# Patient Record
Sex: Female | Born: 1950 | Hispanic: Yes | Marital: Married | Smoking: Never smoker
Health system: Southern US, Community
[De-identification: ages and names within clinical notes are randomized; demographics above are authoritative.]

## PROBLEM LIST (undated history)

## (undated) DIAGNOSIS — I10 Essential (primary) hypertension: Secondary | ICD-10-CM

## (undated) DIAGNOSIS — J45909 Unspecified asthma, uncomplicated: Secondary | ICD-10-CM

## (undated) HISTORY — PX: ABDOMINAL HYSTERECTOMY: SHX81

## (undated) HISTORY — PX: SHOULDER ARTHROSCOPY DISTAL CLAVICLE EXCISION AND OPEN ROTATOR CUFF REPAIR: SHX2396

---

## 2004-06-27 ENCOUNTER — Ambulatory Visit: Payer: Self-pay | Admitting: Obstetrics and Gynecology

## 2004-07-23 ENCOUNTER — Ambulatory Visit: Payer: Self-pay

## 2014-12-22 ENCOUNTER — Other Ambulatory Visit: Payer: Self-pay | Admitting: Primary Care

## 2014-12-22 DIAGNOSIS — M810 Age-related osteoporosis without current pathological fracture: Secondary | ICD-10-CM

## 2016-11-05 ENCOUNTER — Other Ambulatory Visit: Payer: Self-pay | Admitting: Primary Care

## 2016-11-05 DIAGNOSIS — Z139 Encounter for screening, unspecified: Secondary | ICD-10-CM

## 2016-11-05 DIAGNOSIS — M810 Age-related osteoporosis without current pathological fracture: Secondary | ICD-10-CM

## 2017-01-25 ENCOUNTER — Emergency Department: Payer: No Typology Code available for payment source

## 2017-01-25 ENCOUNTER — Emergency Department
Admission: EM | Admit: 2017-01-25 | Discharge: 2017-01-25 | Disposition: A | Discharge status: Home / Self Care | Payer: No Typology Code available for payment source | Attending: Emergency Medicine | Admitting: Emergency Medicine

## 2017-01-25 ENCOUNTER — Encounter: Payer: Self-pay | Admitting: Intensive Care

## 2017-01-25 DIAGNOSIS — J45909 Unspecified asthma, uncomplicated: Secondary | ICD-10-CM | POA: Diagnosis not present

## 2017-01-25 DIAGNOSIS — M545 Low back pain: Secondary | ICD-10-CM | POA: Insufficient documentation

## 2017-01-25 DIAGNOSIS — I10 Essential (primary) hypertension: Secondary | ICD-10-CM | POA: Insufficient documentation

## 2017-01-25 DIAGNOSIS — M542 Cervicalgia: Secondary | ICD-10-CM | POA: Insufficient documentation

## 2017-01-25 HISTORY — DX: Essential (primary) hypertension: I10

## 2017-01-25 HISTORY — DX: Unspecified asthma, uncomplicated: J45.909

## 2017-01-25 MED ORDER — IBUPROFEN 400 MG PO TABS: 400.0000 mg | ORAL_TABLET | Freq: Four times a day (QID) | ORAL | 0 refills | Status: DC | PRN

## 2017-01-25 MED ORDER — CYCLOBENZAPRINE HCL 5 MG PO TABS: 5.0000 mg | ORAL_TABLET | Freq: Three times a day (TID) | ORAL | 0 refills | Status: DC | PRN

## 2017-01-25 MED ORDER — IBUPROFEN 400 MG PO TABS: 400.0000 mg | ORAL_TABLET | Freq: Four times a day (QID) | ORAL | 0 refills | Status: AC | PRN

## 2017-01-25 MED ORDER — CYCLOBENZAPRINE HCL 5 MG PO TABS: 5.0000 mg | ORAL_TABLET | Freq: Every day | ORAL | 0 refills | Status: AC

## 2017-01-25 NOTE — Discharge Instructions (Signed)
Follow-up with primary care as soon as possible for further evaluation of thyroid nodule.

## 2017-01-25 NOTE — ED Notes (Signed)
Pt able to ambulate independently and denies increased pain while walking.
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
## 2017-01-31 ENCOUNTER — Other Ambulatory Visit: Payer: Self-pay | Admitting: Primary Care

## 2017-01-31 DIAGNOSIS — E041 Nontoxic single thyroid nodule: Secondary | ICD-10-CM

## 2017-02-05 ENCOUNTER — Ambulatory Visit: Payer: Medicare HMO

## 2017-11-05 ENCOUNTER — Other Ambulatory Visit: Payer: Self-pay | Admitting: Primary Care

## 2017-11-17 ENCOUNTER — Encounter: Payer: Self-pay | Admitting: Primary Care

## 2017-11-18 ENCOUNTER — Telehealth: Payer: Self-pay | Admitting: Gastroenterology

## 2017-11-18 NOTE — Telephone Encounter (Signed)
Gastroenterology Pre-Procedure Review  Request Date: 12/10/2017    [facility] Requesting Physician: Dr. Bonna Gains   PATIENT REVIEW QUESTIONS: The patient responded to the following health history questions as indicated:    1. Are you having any GI issues? no 2. Do you have a personal history of Polyps? yes (5 yrs ???) 3. Do you have a family history of Colon Cancer or Polyps? no 4. Diabetes Mellitus? no 5. Joint replacements in the past 12 months?no 6. Major health problems in the past 3 months?no 7. Any artificial heart valves, MVP, or defibrillator?no     MEDICATIONS & ALLERGIES:    Patient reports the following regarding taking any anticoagulation/antiplatelet therapy:   Plavix, Coumadin, Eliquis, Xarelto, Lovenox, Pradaxa, Brilinta, or Effient? no Aspirin? no  Patient confirms/reports the following medications:  Current Outpatient Medications  Medication Sig Dispense Refill  . ibuprofen (ADVIL,MOTRIN) 400 MG tablet Take 1 tablet (400 mg total) by mouth every 6 (six) hours as needed. 30 tablet 0   No current facility-administered medications for this visit.     Patient confirms/reports the following allergies:  Allergies  Allergen Reactions  . Penicillins   . Ultram [Tramadol Hcl]     No orders of the defined types were placed in this encounter.   AUTHORIZATION INFORMATION Primary Insurance: 1D#: Group #:  Secondary Insurance: 1D#: Group #:  SCHEDULE INFORMATION: Date: 12/10/17   [place]   Time: Location: [place]

## 2017-11-19 ENCOUNTER — Other Ambulatory Visit: Payer: Self-pay

## 2017-11-19 DIAGNOSIS — Z1211 Encounter for screening for malignant neoplasm of colon: Secondary | ICD-10-CM
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
## 2017-12-04 ENCOUNTER — Telehealth: Payer: Self-pay

## 2017-12-04 NOTE — Telephone Encounter (Signed)
Pt states she can understand me and is to let me know if she does not. We needed to reschedule her colonoscopy that was previously scheduled on 9/11 and pt agreed to have procedure done on 12/24/2017. She is aware to recalculate date on prep information.

## 2017-12-23 ENCOUNTER — Encounter: Payer: Self-pay | Admitting: Emergency Medicine

## 2017-12-24 ENCOUNTER — Encounter
Admission: RE | Disposition: A | Discharge status: Home / Self Care | Payer: Self-pay | Source: Ambulatory Visit | Attending: Gastroenterology

## 2017-12-24 ENCOUNTER — Ambulatory Visit: Payer: Medicare HMO | Admitting: Anesthesiology

## 2017-12-24 ENCOUNTER — Other Ambulatory Visit: Payer: Self-pay

## 2017-12-24 ENCOUNTER — Ambulatory Visit
Admission: RE | Admit: 2017-12-24 | Discharge: 2017-12-24 | Disposition: A | Discharge status: Home / Self Care | Payer: Medicare HMO | Source: Ambulatory Visit | Attending: Gastroenterology | Admitting: Gastroenterology

## 2017-12-24 DIAGNOSIS — D125 Benign neoplasm of sigmoid colon: Secondary | ICD-10-CM

## 2017-12-24 DIAGNOSIS — Z880 Allergy status to penicillin: Secondary | ICD-10-CM | POA: Diagnosis not present

## 2017-12-24 DIAGNOSIS — J45909 Unspecified asthma, uncomplicated: Secondary | ICD-10-CM | POA: Diagnosis not present

## 2017-12-24 DIAGNOSIS — D122 Benign neoplasm of ascending colon: Secondary | ICD-10-CM

## 2017-12-24 DIAGNOSIS — Z79899 Other long term (current) drug therapy: Secondary | ICD-10-CM | POA: Insufficient documentation

## 2017-12-24 DIAGNOSIS — K635 Polyp of colon: Secondary | ICD-10-CM

## 2017-12-24 DIAGNOSIS — Z1211 Encounter for screening for malignant neoplasm of colon: Secondary | ICD-10-CM | POA: Diagnosis not present

## 2017-12-24 DIAGNOSIS — I10 Essential (primary) hypertension: Secondary | ICD-10-CM | POA: Diagnosis not present

## 2017-12-24 DIAGNOSIS — Z885 Allergy status to narcotic agent status: Secondary | ICD-10-CM | POA: Diagnosis not present

## 2017-12-24 DIAGNOSIS — K6289 Other specified diseases of anus and rectum: Secondary | ICD-10-CM | POA: Diagnosis not present

## 2017-12-24 HISTORY — PX: COLONOSCOPY WITH PROPOFOL: SHX5780

## 2017-12-24 SURGERY — COLONOSCOPY WITH PROPOFOL
Anesthesia: General

## 2017-12-24 MED ORDER — PROPOFOL 10 MG/ML IV BOLUS
INTRAVENOUS | Status: DC | PRN
  Administered 2017-12-24: 100 mg via INTRAVENOUS

## 2017-12-24 MED ORDER — PROPOFOL 500 MG/50ML IV EMUL
INTRAVENOUS | Status: AC
  Filled 2017-12-24: qty 50

## 2017-12-24 MED ORDER — PROPOFOL 500 MG/50ML IV EMUL
INTRAVENOUS | Status: DC | PRN
  Administered 2017-12-24: 120 ug/kg/min via INTRAVENOUS

## 2017-12-24 MED ORDER — LIDOCAINE HCL (PF) 2 % IJ SOLN
INTRAMUSCULAR | Status: AC
  Filled 2017-12-24: qty 10

## 2017-12-24 MED ORDER — PROPOFOL 10 MG/ML IV BOLUS
INTRAVENOUS | Status: AC
  Filled 2017-12-24: qty 20

## 2017-12-24 MED ORDER — SODIUM CHLORIDE 0.9 % IJ SOLN
INTRAMUSCULAR | Status: DC | PRN
  Administered 2017-12-24: 5 mL

## 2017-12-24 MED ORDER — SODIUM CHLORIDE 0.9 % IV SOLN
INTRAVENOUS | Status: DC
  Administered 2017-12-24: 09:00:00 via INTRAVENOUS

## 2017-12-24 MED ORDER — LIDOCAINE HCL (CARDIAC) PF 100 MG/5ML IV SOSY
PREFILLED_SYRINGE | INTRAVENOUS | Status: DC | PRN
  Administered 2017-12-24: 100 mg via INTRAVENOUS

## 2017-12-24 NOTE — OR Nursing (Signed)
Pt declines spanish interpreter.  She speaks very good english and answers questions appropriately.

## 2017-12-24 NOTE — Anesthesia Post-op Follow-up Note (Signed)
Anesthesia QCDR form completed.        

## 2017-12-24 NOTE — Transfer of Care (Signed)
Immediate Anesthesia Transfer of Care Note  Patient: Kendra French  Procedure(s) Performed: COLONOSCOPY WITH PROPOFOL (N/A )  Patient Location: PACU and Endoscopy Unit  Anesthesia Type:General  Level of Consciousness: sedated  Airway & Oxygen Therapy: Patient Spontanous Breathing  Post-op Assessment: Report given to RN  Post vital signs: stable  Last Vitals:  Vitals Value Taken Time  BP 96/52 12/24/2017  9:17 AM  Temp 36.6 C 12/24/2017  9:17 AM  Pulse 76 12/24/2017  9:18 AM  Resp 19 12/24/2017  9:18 AM  SpO2 95 % 12/24/2017  9:18 AM  Vitals shown include unvalidated device data.  Last Pain:  Vitals:   12/24/17 0917  TempSrc: Tympanic  PainSc: Asleep         Complications: No apparent anesthesia complications

## 2017-12-24 NOTE — H&P (Signed)
Vonda Antigua, MD [address] [place], [place], [place], [address] [address], [place], [place], [place], [address] Phone: [phone]  Fax: [phone]  Primary Care Physician:  [facility]   Pre-Procedure History & Physical: HPI:  Kendra French is a 67 y.o. female is here for a colonoscopy.   Past Medical History:  Diagnosis Date  . Asthma   . Hypertension     Past Surgical History:  Procedure Laterality Date  . ABDOMINAL HYSTERECTOMY    . SHOULDER ARTHROSCOPY DISTAL CLAVICLE EXCISION AND OPEN ROTATOR CUFF REPAIR Bilateral     Prior to Admission medications   Medication Sig Start Date End Date Taking? Authorizing Provider  hydrochlorothiazide (HYDRODIURIL) 25 MG tablet Take 25 mg by mouth daily.   Yes [provider]  lisinopril (PRINIVIL,ZESTRIL) 2.5 MG tablet Take 2.5 mg by mouth daily.   Yes [provider]  ibuprofen (ADVIL,MOTRIN) 400 MG tablet Take 1 tablet (400 mg total) by mouth every 6 (six) hours as needed. 01/25/17   Laban Emperor, PA-C    Allergies as of 11/20/2017 - Review Complete 01/25/2017  Allergen Reaction Noted  . Penicillins  01/25/2017  . Ultram [tramadol hcl]  01/25/2017    History reviewed. No pertinent family history.  Social History   Socioeconomic History  . Marital status: Married    Spouse name: Not on file  . Number of children: Not on file  . Years of education: Not on file  . Highest education level: Not on file  Occupational History  . Not on file  Social Needs  . Financial resource strain: Not on file  . Food insecurity:    Worry: Not on file    Inability: Not on file  . Transportation needs:    Medical: Not on file    Non-medical: Not on file  Tobacco Use  . Smoking status: Never Smoker  . Smokeless tobacco: Never Used  Substance and Sexual Activity  . Alcohol use: Yes    Comment: 1-2 times per year  . Drug use: Never  . Sexual activity: Not on file    Lifestyle  . Physical activity:    Days per week: Not on file    Minutes per session: Not on file  . Stress: Not on file  Relationships  . Social connections:    Talks on phone: Not on file    Gets together: Not on file    Attends religious service: Not on file    Active member of club or organization: Not on file    Attends meetings of clubs or organizations: Not on file    Relationship status: Not on file  . Intimate partner violence:    Fear of current or ex partner: Not on file    Emotionally abused: Not on file    Physically abused: Not on file    Forced sexual activity: Not on file  Other Topics Concern  . Not on file  Social History Narrative  . Not on file    Review of Systems: See HPI, otherwise negative ROS  Physical Exam: BP (!) 162/78   Pulse 84   Temp (!) 96.5 F (35.8 C) (Tympanic)   Resp 16   Ht 4\' 11"  (1.499 m)   Wt 62.6 kg   SpO2 98%   BMI 27.87 kg/m  General:   Alert,  pleasant and cooperative in NAD Head:  Normocephalic and atraumatic. Neck:  Supple; no masses or thyromegaly. Lungs:  Clear throughout  to auscultation, normal respiratory effort.    Heart:  +S1, +S2, Regular rate and rhythm, No edema. Abdomen:  Soft, nontender and nondistended. Normal bowel sounds, without guarding, and without rebound.   Neurologic:  Alert and  oriented x4;  grossly normal neurologically.  Impression/Plan: MARKEA RUZICH is here for a colonoscopy to be performed for average risk screening.  Risks, benefits, limitations, and alternatives regarding  colonoscopy have been reviewed with the patient.  Questions have been answered.  All parties agreeable.   Virgel Manifold, MD  12/24/2017, 8:23 AM

## 2017-12-24 NOTE — Anesthesia Postprocedure Evaluation (Signed)
Anesthesia Post Note  Patient: Kendra French  Procedure(s) Performed: COLONOSCOPY WITH PROPOFOL (N/A )  Patient location during evaluation: Endoscopy Anesthesia Type: General Level of consciousness: awake and alert Pain management: pain level controlled Vital Signs Assessment: post-procedure vital signs reviewed and stable Respiratory status: spontaneous breathing, nonlabored ventilation and respiratory function stable Cardiovascular status: blood pressure returned to baseline and stable Postop Assessment: no apparent nausea or vomiting Anesthetic complications: no     Last Vitals:  Vitals:   12/24/17 0947 12/24/17 0957  BP: 138/78 96/80  Pulse: 67 (!) 59  Resp: 17 14  Temp:    SpO2: 99% 100%    Last Pain:  Vitals:   12/24/17 0957  TempSrc:   PainSc: 0-No pain                 Alphonsus Sias

## 2017-12-24 NOTE — Op Note (Addendum)
[facility] Gastroenterology Patient Name: Kendra French Procedure Date: 12/24/2017 7:28 AM MRN: [id number] Account #: [id number] Date of Birth: 1951/01/24 Admit Type: Outpatient Age: 67 Room: [facility] ENDO ROOM 2 Gender: Female Note Status: Finalized Procedure:            Colonoscopy Indications:          Screening for colorectal malignant neoplasm Providers:            Varnita B. Bonna Gains MD, MD Referring MD:         [facility]Barton Dubois (Referring MD) Medicines:            Monitored Anesthesia Care Complications:        No immediate complications. Procedure:            Pre-Anesthesia Assessment:                       - ASA Grade Assessment: II - A patient with mild                        systemic disease.                       - Prior to the procedure, a History and Physical was                        performed, and patient medications, allergies and                        sensitivities were reviewed. The patient's tolerance of                        previous anesthesia was reviewed.                       - The risks and benefits of the procedure and the                        sedation options and risks were discussed with the                        patient. All questions were answered and informed                        consent was obtained.                       - Patient identification and proposed procedure were                        verified prior to the procedure by the physician, the                        nurse, the anesthesiologist, the anesthetist and the                        technician. The procedure was verified in the procedure                        room.  After obtaining informed consent, the colonoscope was                        passed under direct vision. Throughout the procedure,                        the patient's blood pressure, pulse, and oxygen                        saturations were monitored  continuously. The                        Colonoscope was introduced through the anus and                        advanced to the the cecum, identified by appendiceal                        orifice and ileocecal valve. The colonoscopy was                        performed with ease. The patient tolerated the                        procedure well. The quality of the bowel preparation                        was good. Findings:      The perianal and digital rectal examinations were normal.      A 7 mm polyp was found in the ascending colon. The polyp was flat. Area       was successfully injected with saline for lesion assessment, and this       injection appeared to lift the lesion adequately. The polyp was removed       with a saline injection-lift technique using a cold snare. Resection and       retrieval were complete.      A 5 mm polyp was found in the sigmoid colon. The polyp was flat. The       polyp was removed with a cold snare. Resection and retrieval were       complete.      The exam was otherwise without abnormality.      The rectum, sigmoid colon, descending colon, transverse colon, ascending       colon and cecum appeared normal.      Anal papilla(e) were hypertrophied.      The retroflexed view of the distal rectum and anal verge was normal and       showed no anal or rectal abnormalities otherwise. Impression:           - One 7 mm polyp in the ascending colon, removed using                        injection-lift and a cold snare. Resected and                        retrieved. Injected.                       - One 5 mm polyp in the sigmoid colon, removed with a  cold snare. Resected and retrieved.                       - The examination was otherwise normal.                       - The rectum, sigmoid colon, descending colon,                        transverse colon, ascending colon and cecum are normal.                       - Anal papilla(e) were  hypertrophied.                       - The distal rectum and anal verge are normal on                        retroflexion view. Recommendation:       - Discharge patient to home (with escort).                       - Advance diet as tolerated.                       - Continue present medications.                       - Await pathology results.                       - Repeat colonoscopy date to be determined after                        pending pathology results are reviewed for surveillance                        (2015 colonoscopy and 2010 colonoscopy results under                        care everywhere).                       - The findings and recommendations were discussed with                        the patient.                       - The findings and recommendations were discussed with                        the patient's family.                       - Return to primary care physician as previously                        scheduled. Procedure Code(s):    --- Professional ---                       [phone], Colonoscopy, flexible; with removal of tumor(s),  polyp(s), or other lesion(s) by snare technique                       [id number], Colonoscopy, flexible; with directed submucosal                        injection(s), any substance Diagnosis Code(s):    --- Professional ---                       Z12.11, Encounter for screening for malignant neoplasm                        of colon                       D12.5, Benign neoplasm of sigmoid colon                       D12.2, Benign neoplasm of ascending colon                       K62.89, Other specified diseases of anus and rectum CPT copyright 2017 American Medical Association. All rights reserved. The codes documented in this report are preliminary and upon coder review may  be revised to meet current compliance requirements.  Vonda Antigua, MD Margretta Sidle B. Bonna Gains MD, MD 12/24/2017 9:15:57 AM This report has  been signed electronically. Number of Addenda: 0 Note Initiated On: 12/24/2017 7:28 AM Scope Withdrawal Time: 0 hours 16 minutes 44 seconds  Total Procedure Duration: 0 hours 22 minutes 58 seconds  Estimated Blood Loss: Estimated blood loss: none.      [facility]

## 2017-12-24 NOTE — Anesthesia Preprocedure Evaluation (Signed)
Anesthesia Evaluation  Patient identified by MRN, date of birth, ID band Patient awake    Reviewed: Allergy & Precautions, H&P , NPO status , reviewed documented beta blocker date and time   Airway Mallampati: II  TM Distance: >3 FB Neck ROM: full    Dental  (+) Caps, Implants, Chipped   Pulmonary asthma ,    Pulmonary exam normal        Cardiovascular hypertension, Normal cardiovascular exam     Neuro/Psych    GI/Hepatic neg GERD  Controlled,  Endo/Other    Renal/GU      Musculoskeletal   Abdominal   Peds  Hematology   Anesthesia Other Findings Past Medical History: No date: Asthma No date: Hypertension  History reviewed. No pertinent surgical history.     Reproductive/Obstetrics                             Anesthesia Physical Anesthesia Plan  ASA: II  Anesthesia Plan: General   Post-op Pain Management:    Induction: Intravenous  PONV Risk Score and Plan: 3 and Treatment may vary due to age or medical condition and TIVA  Airway Management Planned: Nasal Cannula and Natural Airway  Additional Equipment:   Intra-op Plan:   Post-operative Plan:   Informed Consent: I have reviewed the patients History and Physical, chart, labs and discussed the procedure including the risks, benefits and alternatives for the proposed anesthesia with the patient or authorized representative who has indicated his/her understanding and acceptance.   Dental Advisory Given  Plan Discussed with:   Anesthesia Plan Comments:         Anesthesia Quick Evaluation

## 2017-12-25 ENCOUNTER — Encounter: Payer: Self-pay | Admitting: Gastroenterology

## 2017-12-25 LAB — SURGICAL PATHOLOGY

## 2017-12-26 ENCOUNTER — Encounter: Payer: Self-pay | Admitting: Gastroenterology

## 2018-10-15 ENCOUNTER — Other Ambulatory Visit: Payer: Self-pay | Admitting: *Deleted

## 2018-10-15 DIAGNOSIS — Z20822 Contact with and (suspected) exposure to covid-19: Secondary | ICD-10-CM

## 2018-10-19 LAB — NOVEL CORONAVIRUS, NAA: SARS-CoV-2, NAA: NOT DETECTED

## 2018-11-09 ENCOUNTER — Other Ambulatory Visit: Payer: Self-pay | Admitting: Primary Care

## 2018-11-09 DIAGNOSIS — Z1231 Encounter for screening mammogram for malignant neoplasm of breast: Secondary | ICD-10-CM

## 2018-11-09 DIAGNOSIS — M810 Age-related osteoporosis without current pathological fracture: Secondary | ICD-10-CM

## 2019-01-20 ENCOUNTER — Other Ambulatory Visit: Payer: Medicare HMO

## 2019-12-16 ENCOUNTER — Other Ambulatory Visit: Payer: Self-pay | Admitting: Primary Care

## 2019-12-16 DIAGNOSIS — Z1231 Encounter for screening mammogram for malignant neoplasm of breast: Secondary | ICD-10-CM
